# Patient Record
Sex: Female | Born: 1945 | Race: White | Hispanic: No | Marital: Married | Smoking: Never smoker
Health system: Southern US, Community
[De-identification: ages and names within clinical notes are randomized; demographics above are authoritative.]

## PROBLEM LIST (undated history)

## (undated) DIAGNOSIS — Z8619 Personal history of other infectious and parasitic diseases: Secondary | ICD-10-CM

## (undated) DIAGNOSIS — A809 Acute poliomyelitis, unspecified: Secondary | ICD-10-CM

## (undated) DIAGNOSIS — M2550 Pain in unspecified joint: Secondary | ICD-10-CM

## (undated) DIAGNOSIS — E785 Hyperlipidemia, unspecified: Secondary | ICD-10-CM

## (undated) DIAGNOSIS — H269 Unspecified cataract: Secondary | ICD-10-CM

## (undated) DIAGNOSIS — I10 Essential (primary) hypertension: Secondary | ICD-10-CM

## (undated) DIAGNOSIS — Z9289 Personal history of other medical treatment: Secondary | ICD-10-CM

## (undated) DIAGNOSIS — E039 Hypothyroidism, unspecified: Secondary | ICD-10-CM

## (undated) DIAGNOSIS — R3915 Urgency of urination: Secondary | ICD-10-CM

## (undated) DIAGNOSIS — R51 Headache: Secondary | ICD-10-CM

## (undated) DIAGNOSIS — M549 Dorsalgia, unspecified: Secondary | ICD-10-CM

## (undated) DIAGNOSIS — M2540 Effusion, unspecified joint: Secondary | ICD-10-CM

## (undated) DIAGNOSIS — K5790 Diverticulosis of intestine, part unspecified, without perforation or abscess without bleeding: Secondary | ICD-10-CM

## (undated) DIAGNOSIS — M1990 Unspecified osteoarthritis, unspecified site: Secondary | ICD-10-CM

## (undated) DIAGNOSIS — Z8709 Personal history of other diseases of the respiratory system: Secondary | ICD-10-CM

## (undated) DIAGNOSIS — R609 Edema, unspecified: Secondary | ICD-10-CM

## (undated) DIAGNOSIS — R350 Frequency of micturition: Secondary | ICD-10-CM

## (undated) DIAGNOSIS — M810 Age-related osteoporosis without current pathological fracture: Secondary | ICD-10-CM

## (undated) DIAGNOSIS — R519 Headache, unspecified: Secondary | ICD-10-CM

## (undated) DIAGNOSIS — J302 Other seasonal allergic rhinitis: Secondary | ICD-10-CM

## (undated) HISTORY — PX: COLONOSCOPY: SHX174

## (undated) HISTORY — PX: DILATION AND CURETTAGE OF UTERUS: SHX78

---
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
## 1976-01-29 HISTORY — PX: OTHER SURGICAL HISTORY: SHX169

## 1980-01-29 HISTORY — PX: ABDOMINAL HYSTERECTOMY: SHX81

## 2000-01-29 HISTORY — PX: CHOLECYSTECTOMY: SHX55

## 2013-01-28 HISTORY — PX: OTHER SURGICAL HISTORY: SHX169

## 2014-12-02 ENCOUNTER — Other Ambulatory Visit: Payer: Self-pay | Admitting: Orthopedic Surgery

## 2014-12-06 ENCOUNTER — Ambulatory Visit (HOSPITAL_COMMUNITY)
Admission: RE | Admit: 2014-12-06 | Discharge: 2014-12-06 | Disposition: A | Discharge status: Home / Self Care | Payer: Medicare Other | Source: Ambulatory Visit | Attending: Orthopedic Surgery | Admitting: Orthopedic Surgery

## 2014-12-06 ENCOUNTER — Encounter (HOSPITAL_COMMUNITY)
Admission: RE | Admit: 2014-12-06 | Discharge: 2014-12-06 | Disposition: A | Discharge status: Home / Self Care | Payer: Medicare Other | Source: Ambulatory Visit | Attending: Orthopedic Surgery | Admitting: Orthopedic Surgery

## 2014-12-06 ENCOUNTER — Encounter (HOSPITAL_COMMUNITY): Payer: Self-pay

## 2014-12-06 DIAGNOSIS — R001 Bradycardia, unspecified: Secondary | ICD-10-CM | POA: Diagnosis not present

## 2014-12-06 DIAGNOSIS — Z01818 Encounter for other preprocedural examination: Secondary | ICD-10-CM | POA: Insufficient documentation

## 2014-12-06 DIAGNOSIS — R9431 Abnormal electrocardiogram [ECG] [EKG]: Secondary | ICD-10-CM | POA: Diagnosis not present

## 2014-12-06 DIAGNOSIS — Z01810 Encounter for preprocedural cardiovascular examination: Secondary | ICD-10-CM | POA: Diagnosis not present

## 2014-12-06 DIAGNOSIS — I10 Essential (primary) hypertension: Secondary | ICD-10-CM | POA: Insufficient documentation

## 2014-12-06 DIAGNOSIS — I4510 Unspecified right bundle-branch block: Secondary | ICD-10-CM | POA: Insufficient documentation

## 2014-12-06 DIAGNOSIS — M1990 Unspecified osteoarthritis, unspecified site: Secondary | ICD-10-CM | POA: Diagnosis not present

## 2014-12-06 DIAGNOSIS — E785 Hyperlipidemia, unspecified: Secondary | ICD-10-CM | POA: Insufficient documentation

## 2014-12-06 DIAGNOSIS — E039 Hypothyroidism, unspecified: Secondary | ICD-10-CM | POA: Insufficient documentation

## 2014-12-06 DIAGNOSIS — Z79899 Other long term (current) drug therapy: Secondary | ICD-10-CM | POA: Insufficient documentation

## 2014-12-06 DIAGNOSIS — M1712 Unilateral primary osteoarthritis, left knee: Secondary | ICD-10-CM | POA: Diagnosis not present

## 2014-12-06 DIAGNOSIS — A809 Acute poliomyelitis, unspecified: Secondary | ICD-10-CM | POA: Insufficient documentation

## 2014-12-06 DIAGNOSIS — Z01812 Encounter for preprocedural laboratory examination: Secondary | ICD-10-CM | POA: Insufficient documentation

## 2014-12-06 DIAGNOSIS — N390 Urinary tract infection, site not specified: Secondary | ICD-10-CM | POA: Diagnosis not present

## 2014-12-06 HISTORY — DX: Headache, unspecified: R51.9

## 2014-12-06 HISTORY — DX: Unspecified cataract: H26.9

## 2014-12-06 HISTORY — DX: Hypothyroidism, unspecified: E03.9

## 2014-12-06 HISTORY — DX: Diverticulosis of intestine, part unspecified, without perforation or abscess without bleeding: K57.90

## 2014-12-06 HISTORY — DX: Personal history of other medical treatment: Z92.89

## 2014-12-06 HISTORY — DX: Headache: R51

## 2014-12-06 HISTORY — DX: Pain in unspecified joint: M25.50

## 2014-12-06 HISTORY — DX: Age-related osteoporosis without current pathological fracture: M81.0

## 2014-12-06 HISTORY — DX: Personal history of other infectious and parasitic diseases: Z86.19

## 2014-12-06 HISTORY — DX: Hyperlipidemia, unspecified: E78.5

## 2014-12-06 HISTORY — DX: Essential (primary) hypertension: I10

## 2014-12-06 HISTORY — DX: Edema, unspecified: R60.9

## 2014-12-06 HISTORY — DX: Urgency of urination: R39.15

## 2014-12-06 HISTORY — DX: Frequency of micturition: R35.0

## 2014-12-06 HISTORY — DX: Dorsalgia, unspecified: M54.9

## 2014-12-06 HISTORY — DX: Effusion, unspecified joint: M25.40

## 2014-12-06 HISTORY — DX: Personal history of other diseases of the respiratory system: Z87.09

## 2014-12-06 HISTORY — DX: Unspecified osteoarthritis, unspecified site: M19.90

## 2014-12-06 HISTORY — DX: Acute poliomyelitis, unspecified: A80.9

## 2014-12-06 HISTORY — DX: Other seasonal allergic rhinitis: J30.2

## 2014-12-06 LAB — CBC WITH DIFFERENTIAL/PLATELET
BASOS PCT: 0 %
Basophils Absolute: 0 10*3/uL (ref 0.0–0.1)
EOS ABS: 0.4 10*3/uL (ref 0.0–0.7)
EOS PCT: 3 %
HEMATOCRIT: 39.3 % (ref 36.0–46.0)
Hemoglobin: 12.6 g/dL (ref 12.0–15.0)
Lymphocytes Relative: 22 %
Lymphs Abs: 3 10*3/uL (ref 0.7–4.0)
MCH: 28.8 pg (ref 26.0–34.0)
MCHC: 32.1 g/dL (ref 30.0–36.0)
MCV: 89.9 fL (ref 78.0–100.0)
MONO ABS: 0.8 10*3/uL (ref 0.1–1.0)
MONOS PCT: 6 %
Neutro Abs: 9.4 10*3/uL — ABNORMAL HIGH (ref 1.7–7.7)
Neutrophils Relative %: 69 %
PLATELETS: 293 10*3/uL (ref 150–400)
RBC: 4.37 MIL/uL (ref 3.87–5.11)
RDW: 13.7 % (ref 11.5–15.5)
WBC: 13.6 10*3/uL — ABNORMAL HIGH (ref 4.0–10.5)

## 2014-12-06 LAB — URINALYSIS, ROUTINE W REFLEX MICROSCOPIC
Bilirubin Urine: NEGATIVE
Glucose, UA: NEGATIVE mg/dL
HGB URINE DIPSTICK: NEGATIVE
Ketones, ur: NEGATIVE mg/dL
NITRITE: NEGATIVE
PROTEIN: NEGATIVE mg/dL
Specific Gravity, Urine: 1.006 (ref 1.005–1.030)
UROBILINOGEN UA: 0.2 mg/dL (ref 0.0–1.0)
pH: 6.5 (ref 5.0–8.0)

## 2014-12-06 LAB — URINE MICROSCOPIC-ADD ON

## 2014-12-06 LAB — COMPREHENSIVE METABOLIC PANEL
ALBUMIN: 3.8 g/dL (ref 3.5–5.0)
ALT: 23 U/L (ref 14–54)
ANION GAP: 10 (ref 5–15)
AST: 24 U/L (ref 15–41)
Alkaline Phosphatase: 100 U/L (ref 38–126)
BILIRUBIN TOTAL: 0.8 mg/dL (ref 0.3–1.2)
BUN: 15 mg/dL (ref 6–20)
CHLORIDE: 101 mmol/L (ref 101–111)
CO2: 27 mmol/L (ref 22–32)
Calcium: 9 mg/dL (ref 8.9–10.3)
Creatinine, Ser: 0.92 mg/dL (ref 0.44–1.00)
GFR calc Af Amer: >60 mL/min (ref >60)
GFR calc non Af Amer: >60 mL/min (ref >60)
GLUCOSE: 115 mg/dL — AB (ref 65–99)
POTASSIUM: 4 mmol/L (ref 3.5–5.1)
SODIUM: 138 mmol/L (ref 135–145)
TOTAL PROTEIN: 7.6 g/dL (ref 6.5–8.1)

## 2014-12-06 LAB — SURGICAL PCR SCREEN
MRSA, PCR: NEGATIVE
STAPHYLOCOCCUS AUREUS: NEGATIVE

## 2014-12-06 LAB — PROTIME-INR
INR: 1.07 (ref 0.00–1.49)
Prothrombin Time: 14.1 seconds (ref 11.6–15.2)

## 2014-12-06 LAB — APTT: APTT: 35 s (ref 24–37)

## 2014-12-06 MED ORDER — CHLORHEXIDINE GLUCONATE 4 % EX LIQD
60.0000 mL | Freq: Once | CUTANEOUS | Status: DC
Start: 2014-12-06 — End: 2014-12-07

## 2014-12-06 MED ORDER — CHLORHEXIDINE GLUCONATE 4 % EX LIQD: 60.0000 mL | Freq: Once | CUTANEOUS | Status: DC

## 2014-12-06 NOTE — Pre-Procedure Instructions (Signed)
Carrie Shade CellarSusan House  12/06/2014      [facility] [id number] - [place], [place] - [address] AT [facility] [address] [place] [id number] Phone: [phone] Fax: [phone]    Your procedure is scheduled on Mon, Nov 21 @ 11:15 AM  [facility] Admitting at 9:15 AM  Call this number if you have problems the morning of surgery:  [phone]   Remember:  Do not eat food or drink liquids after midnight.  Take these medicines the morning of surgery with A SIP OF WATER Flonase(Fluticasone) and Synthroid(Levothyroxine)               No Goody's,BC's,Aleve,Aspirin,Ibuprofen,Advil,Motrin,Fish Oil,or any Herbal Medications.   Do not wear jewelry, make-up or nail polish.  Do not wear lotions, powders, or perfumes.  You may wear deodorant.  Do not shave 48 hours prior to surgery.    Do not bring valuables to the hospital.  [facility] is not responsible for any belongings or valuables.  Contacts, dentures or bridgework may not be worn into surgery.  Leave your suitcase in the car.  After surgery it may be brought to your room.  For patients admitted to the hospital, discharge time will be determined by your treatment team.  Patients discharged the day of surgery will not be allowed to drive home.    Special instructions: [place] - Preparing for Surgery  Before surgery, you can play an important role.  Because skin is not sterile, your skin needs to be as free of germs as possible.  You can reduce the number of germs on you skin by washing with CHG (chlorahexidine gluconate) soap before surgery.  CHG is an antiseptic cleaner which kills germs and bonds with the skin to continue killing germs even after washing.  Please DO NOT use if you have an allergy to CHG or antibacterial soaps.  If your skin becomes reddened/irritated stop using the CHG and inform your nurse when you arrive at Short Stay.  Do not shave (including legs and  underarms) for at least 48 hours prior to the first CHG shower.  You may shave your face.  Please follow these instructions carefully:   1.  Shower with CHG Soap the night before surgery and the                                morning of Surgery.  2.  If you choose to wash your hair, wash your hair first as usual with your       normal shampoo.  3.  After you shampoo, rinse your hair and body thoroughly to remove the                      Shampoo.  4.  Use CHG as you would any other liquid soap.  You can apply chg directly       to the skin and wash gently with scrungie or a clean washcloth.  5.  Apply the CHG Soap to your body ONLY FROM THE NECK DOWN.        Do not use on open wounds or open sores.  Avoid contact with your eyes,       ears, mouth and genitals (private parts).  Wash genitals (private parts)       with your normal soap.  6.  Wash thoroughly, paying  special attention to the area where your surgery        will be performed.  7.  Thoroughly rinse your body with warm water from the neck down.  8.  DO NOT shower/wash with your normal soap after using and rinsing off       the CHG Soap.  9.  Pat yourself dry with a clean towel.            10.  Wear clean pajamas.            11.  Place clean sheets on your bed the night of your first shower and do not        sleep with pets.  Day of Surgery  Do not apply any lotions/deoderants the morning of surgery.  Please wear clean clothes to the hospital/surgery center.     Please read over the following fact sheets that you were given. Pain Booklet, Coughing and Deep Breathing, MRSA Information and Surgical Site Infection Prevention

## 2014-12-06 NOTE — Progress Notes (Addendum)
Medical Md is Dr.Paul Maryellen Pileason in [place]-[phone]  Cardiologist denies having one  Echo denies ever having one  Stress test done 15+ yrs ago(had this done d/t Fosamax causing chest pain)  Heart cath 15+ yrs ago states clean and was determined that Fosamax was the cause of chest pain  EKG denies in past yr  CXR denies having in past yr

## 2014-12-08 LAB — URINE CULTURE: Culture: >=100000

## 2014-12-08 NOTE — Progress Notes (Signed)
Anesthesia Chart Review:  Pt is 69 year old female scheduled for L total knee arthroplasty on 12/19/2014 with Dr. Sherlean FootLucey.   PMH includes: HTN, hyperlipidemia, hypothyroidism, polio. Never smoker. BMI 38.   Medications include: amlodipine, lasix, levothyroidism, losartan.   Preoperative labs reviewed.  Pt has UTI. Notified Dr. Tobin ChadLucey's office.   Chest x-ray 12/06/14 reviewed. No active cardiopulmonary disease.   EKG 12/06/14: Sinus bradycardia (59 bpm). RBBB.   If no changes, I anticipate pt can proceed with surgery as scheduled.   Rica Mastngela Chyrl Elwell, FNP-BC [facility]/Anesthesiology Phone: [phone] 12/08/2014 11:48 AM

## 2014-12-16 MED ORDER — BUPIVACAINE LIPOSOME 1.3 % IJ SUSP
20.0000 mL | Freq: Once | INTRAMUSCULAR | Status: AC
  Administered 2014-12-19: 20 mL
  Filled 2014-12-16: qty 20

## 2014-12-16 MED ORDER — SODIUM CHLORIDE 0.9 % IV SOLN: INTRAVENOUS | Status: DC

## 2014-12-16 MED ORDER — TRANEXAMIC ACID 1000 MG/10ML IV SOLN
1000.0000 mg | INTRAVENOUS | Status: AC
  Administered 2014-12-19: 1000 mg via INTRAVENOUS
  Filled 2014-12-16: qty 10

## 2014-12-19 ENCOUNTER — Inpatient Hospital Stay (HOSPITAL_COMMUNITY): Payer: Medicare Other | Admitting: Emergency Medicine

## 2014-12-19 ENCOUNTER — Inpatient Hospital Stay (HOSPITAL_COMMUNITY)
Admission: RE | Admit: 2014-12-19 | Discharge: 2014-12-20 | DRG: 470 | Disposition: A | Discharge status: Home Health | Payer: Medicare Other | Source: Ambulatory Visit | Attending: Orthopedic Surgery | Admitting: Orthopedic Surgery

## 2014-12-19 ENCOUNTER — Encounter (HOSPITAL_COMMUNITY)
Admission: RE | Disposition: A | Discharge status: Home Health | Payer: Self-pay | Source: Ambulatory Visit | Attending: Orthopedic Surgery

## 2014-12-19 ENCOUNTER — Inpatient Hospital Stay (HOSPITAL_COMMUNITY): Payer: Medicare Other | Admitting: Anesthesiology

## 2014-12-19 ENCOUNTER — Encounter (HOSPITAL_COMMUNITY): Payer: Self-pay | Admitting: Anesthesiology

## 2014-12-19 DIAGNOSIS — Z91048 Other nonmedicinal substance allergy status: Secondary | ICD-10-CM | POA: Diagnosis not present

## 2014-12-19 DIAGNOSIS — D62 Acute posthemorrhagic anemia: Secondary | ICD-10-CM | POA: Diagnosis not present

## 2014-12-19 DIAGNOSIS — R600 Localized edema: Secondary | ICD-10-CM | POA: Diagnosis present

## 2014-12-19 DIAGNOSIS — Z8619 Personal history of other infectious and parasitic diseases: Secondary | ICD-10-CM | POA: Diagnosis not present

## 2014-12-19 DIAGNOSIS — J302 Other seasonal allergic rhinitis: Secondary | ICD-10-CM | POA: Diagnosis present

## 2014-12-19 DIAGNOSIS — Z888 Allergy status to other drugs, medicaments and biological substances status: Secondary | ICD-10-CM | POA: Diagnosis not present

## 2014-12-19 DIAGNOSIS — M1712 Unilateral primary osteoarthritis, left knee: Secondary | ICD-10-CM | POA: Diagnosis present

## 2014-12-19 DIAGNOSIS — E039 Hypothyroidism, unspecified: Secondary | ICD-10-CM | POA: Diagnosis present

## 2014-12-19 DIAGNOSIS — I10 Essential (primary) hypertension: Secondary | ICD-10-CM | POA: Diagnosis present

## 2014-12-19 DIAGNOSIS — Z881 Allergy status to other antibiotic agents status: Secondary | ICD-10-CM | POA: Diagnosis not present

## 2014-12-19 DIAGNOSIS — M810 Age-related osteoporosis without current pathological fracture: Secondary | ICD-10-CM | POA: Diagnosis present

## 2014-12-19 DIAGNOSIS — Z96659 Presence of unspecified artificial knee joint: Secondary | ICD-10-CM

## 2014-12-19 DIAGNOSIS — M25562 Pain in left knee: Secondary | ICD-10-CM | POA: Diagnosis present

## 2014-12-19 HISTORY — PX: TOTAL KNEE ARTHROPLASTY: SHX125

## 2014-12-19 LAB — CBC
HCT: 35.8 % — ABNORMAL LOW (ref 36.0–46.0)
HEMOGLOBIN: 11.3 g/dL — AB (ref 12.0–15.0)
MCH: 28.8 pg (ref 26.0–34.0)
MCHC: 31.6 g/dL (ref 30.0–36.0)
MCV: 91.3 fL (ref 78.0–100.0)
PLATELETS: 267 10*3/uL (ref 150–400)
RBC: 3.92 MIL/uL (ref 3.87–5.11)
RDW: 13.9 % (ref 11.5–15.5)
WBC: 21.5 10*3/uL — AB (ref 4.0–10.5)

## 2014-12-19 LAB — CREATININE, SERUM: CREATININE: 0.82 mg/dL (ref 0.44–1.00)

## 2014-12-19 SURGERY — ARTHROPLASTY, KNEE, TOTAL
Anesthesia: Monitor Anesthesia Care | Site: Knee | Laterality: Left

## 2014-12-19 MED ORDER — SENNA 8.6 MG PO TABS
1.0000 | ORAL_TABLET | Freq: Two times a day (BID) | ORAL | Status: DC
  Administered 2014-12-19 – 2014-12-20 (×2): 8.6 mg via ORAL
  Filled 2014-12-19 (×2): qty 1

## 2014-12-19 MED ORDER — MENTHOL 3 MG MT LOZG: 1.0000 | LOZENGE | OROMUCOSAL | Status: DC | PRN

## 2014-12-19 MED ORDER — FUROSEMIDE 20 MG PO TABS
20.0000 mg | ORAL_TABLET | Freq: Every day | ORAL | Status: DC
  Filled 2014-12-19: qty 1

## 2014-12-19 MED ORDER — BUPIVACAINE IN DEXTROSE 0.75-8.25 % IT SOLN
INTRATHECAL | Status: DC | PRN
  Administered 2014-12-19: 1.8 mL via INTRATHECAL

## 2014-12-19 MED ORDER — TIZANIDINE HCL 4 MG PO TABS
2.0000 mg | ORAL_TABLET | Freq: Four times a day (QID) | ORAL | Status: DC | PRN
Start: 2014-12-19 — End: 2014-12-20
  Administered 2014-12-20: 2 mg via ORAL
  Filled 2014-12-19: qty 1

## 2014-12-19 MED ORDER — LIDOCAINE HCL (CARDIAC) 20 MG/ML IV SOLN
INTRAVENOUS | Status: DC | PRN
  Administered 2014-12-19: 100 mg via INTRAVENOUS

## 2014-12-19 MED ORDER — LACTATED RINGERS IV SOLN
INTRAVENOUS | Status: DC | PRN
  Administered 2014-12-19 (×2): via INTRAVENOUS

## 2014-12-19 MED ORDER — LOSARTAN POTASSIUM 50 MG PO TABS
100.0000 mg | ORAL_TABLET | Freq: Every day | ORAL | Status: DC
  Filled 2014-12-19: qty 2

## 2014-12-19 MED ORDER — DIPHENHYDRAMINE HCL 12.5 MG/5ML PO ELIX: 12.5000 mg | ORAL_SOLUTION | ORAL | Status: DC | PRN

## 2014-12-19 MED ORDER — LEVOTHYROXINE SODIUM 100 MCG PO TABS
100.0000 ug | ORAL_TABLET | Freq: Every day | ORAL | Status: DC
  Administered 2014-12-20: 100 ug via ORAL
  Filled 2014-12-19: qty 1

## 2014-12-19 MED ORDER — PROPOFOL 500 MG/50ML IV EMUL
INTRAVENOUS | Status: DC | PRN
  Administered 2014-12-19: 35 ug/kg/min via INTRAVENOUS

## 2014-12-19 MED ORDER — BUPIVACAINE-EPINEPHRINE (PF) 0.5% -1:200000 IJ SOLN
INTRAMUSCULAR | Status: AC
  Filled 2014-12-19: qty 30

## 2014-12-19 MED ORDER — PHENOL 1.4 % MT LIQD: 1.0000 | OROMUCOSAL | Status: DC | PRN

## 2014-12-19 MED ORDER — FENTANYL CITRATE (PF) 250 MCG/5ML IJ SOLN
INTRAMUSCULAR | Status: AC
  Filled 2014-12-19: qty 5

## 2014-12-19 MED ORDER — MIDAZOLAM HCL 2 MG/2ML IJ SOLN
INTRAMUSCULAR | Status: AC
  Filled 2014-12-19: qty 2

## 2014-12-19 MED ORDER — ACETAMINOPHEN 650 MG RE SUPP
650.0000 mg | Freq: Four times a day (QID) | RECTAL | Status: DC | PRN
Start: 2014-12-19 — End: 2014-12-20

## 2014-12-19 MED ORDER — SODIUM CHLORIDE 0.9 % IR SOLN
Status: DC | PRN
  Administered 2014-12-19: 1000 mL

## 2014-12-19 MED ORDER — ACETAMINOPHEN 325 MG PO TABS: 650.0000 mg | ORAL_TABLET | Freq: Four times a day (QID) | ORAL | Status: DC | PRN

## 2014-12-19 MED ORDER — ACETAMINOPHEN 160 MG/5ML PO SOLN
325.0000 mg | ORAL | Status: DC | PRN
  Filled 2014-12-19: qty 20.3

## 2014-12-19 MED ORDER — TRANEXAMIC ACID 1000 MG/10ML IV SOLN: 1000.0000 mg | Freq: Once | INTRAVENOUS | Status: DC

## 2014-12-19 MED ORDER — FLEET ENEMA 7-19 GM/118ML RE ENEM: 1.0000 | ENEMA | Freq: Once | RECTAL | Status: DC | PRN

## 2014-12-19 MED ORDER — PROPOFOL 10 MG/ML IV BOLUS
INTRAVENOUS | Status: DC | PRN
  Administered 2014-12-19: 10 mg via INTRAVENOUS
  Administered 2014-12-19: 20 mg via INTRAVENOUS
  Administered 2014-12-19: 30 mg via INTRAVENOUS
  Administered 2014-12-19 (×2): 20 mg via INTRAVENOUS

## 2014-12-19 MED ORDER — PROPOFOL 10 MG/ML IV BOLUS
INTRAVENOUS | Status: AC
  Filled 2014-12-19: qty 20

## 2014-12-19 MED ORDER — ZOLPIDEM TARTRATE 5 MG PO TABS: 5.0000 mg | ORAL_TABLET | Freq: Every evening | ORAL | Status: DC | PRN

## 2014-12-19 MED ORDER — METOCLOPRAMIDE HCL 5 MG/ML IJ SOLN: 5.0000 mg | Freq: Three times a day (TID) | INTRAMUSCULAR | Status: DC | PRN

## 2014-12-19 MED ORDER — OXYCODONE HCL ER 10 MG PO T12A
10.0000 mg | EXTENDED_RELEASE_TABLET | Freq: Two times a day (BID) | ORAL | Status: DC
  Administered 2014-12-19 – 2014-12-20 (×2): 10 mg via ORAL
  Filled 2014-12-19 (×2): qty 1

## 2014-12-19 MED ORDER — SODIUM CHLORIDE 0.9 % IV SOLN
INTRAVENOUS | Status: DC
  Administered 2014-12-19: 21:00:00 via INTRAVENOUS

## 2014-12-19 MED ORDER — ENOXAPARIN SODIUM 30 MG/0.3ML ~~LOC~~ SOLN
30.0000 mg | Freq: Two times a day (BID) | SUBCUTANEOUS | Status: DC
  Administered 2014-12-20: 30 mg via SUBCUTANEOUS
  Filled 2014-12-19: qty 0.3

## 2014-12-19 MED ORDER — SODIUM CHLORIDE 0.9 % IJ SOLN
INTRAMUSCULAR | Status: DC | PRN
  Administered 2014-12-19: 20 mL

## 2014-12-19 MED ORDER — ONDANSETRON HCL 4 MG PO TABS: 4.0000 mg | ORAL_TABLET | Freq: Four times a day (QID) | ORAL | Status: DC | PRN

## 2014-12-19 MED ORDER — MIDAZOLAM HCL 5 MG/5ML IJ SOLN
INTRAMUSCULAR | Status: DC | PRN
  Administered 2014-12-19: 2 mg via INTRAVENOUS

## 2014-12-19 MED ORDER — FLUTICASONE PROPIONATE 50 MCG/ACT NA SUSP
2.0000 | Freq: Every day | NASAL | Status: DC | PRN
  Filled 2014-12-19: qty 16

## 2014-12-19 MED ORDER — HYDROMORPHONE HCL 1 MG/ML IJ SOLN
1.0000 mg | INTRAMUSCULAR | Status: DC | PRN
  Administered 2014-12-19 – 2014-12-20 (×2): 1 mg via INTRAVENOUS
  Filled 2014-12-19 (×2): qty 1

## 2014-12-19 MED ORDER — METOCLOPRAMIDE HCL 5 MG PO TABS: 5.0000 mg | ORAL_TABLET | Freq: Three times a day (TID) | ORAL | Status: DC | PRN

## 2014-12-19 MED ORDER — CELECOXIB 200 MG PO CAPS
200.0000 mg | ORAL_CAPSULE | Freq: Two times a day (BID) | ORAL | Status: DC
  Administered 2014-12-19 – 2014-12-20 (×2): 200 mg via ORAL
  Filled 2014-12-19 (×2): qty 1

## 2014-12-19 MED ORDER — OXYCODONE HCL 5 MG/5ML PO SOLN: 5.0000 mg | Freq: Once | ORAL | Status: DC | PRN

## 2014-12-19 MED ORDER — SORBITOL 70 % SOLN: 30.0000 mL | Freq: Every day | Status: DC | PRN

## 2014-12-19 MED ORDER — HYDROMORPHONE HCL 1 MG/ML IJ SOLN
0.2500 mg | INTRAMUSCULAR | Status: DC | PRN
  Administered 2014-12-19 (×4): 0.5 mg via INTRAVENOUS

## 2014-12-19 MED ORDER — ONDANSETRON HCL 4 MG/2ML IJ SOLN
4.0000 mg | Freq: Four times a day (QID) | INTRAMUSCULAR | Status: DC | PRN
  Administered 2014-12-19: 4 mg via INTRAVENOUS
  Filled 2014-12-19: qty 2

## 2014-12-19 MED ORDER — CEFAZOLIN SODIUM-DEXTROSE 2-3 GM-% IV SOLR
INTRAVENOUS | Status: DC | PRN
  Administered 2014-12-19: 2 g via INTRAVENOUS

## 2014-12-19 MED ORDER — HYDROMORPHONE HCL 1 MG/ML IJ SOLN
INTRAMUSCULAR | Status: AC
  Filled 2014-12-19: qty 1

## 2014-12-19 MED ORDER — SENNOSIDES-DOCUSATE SODIUM 8.6-50 MG PO TABS: 1.0000 | ORAL_TABLET | Freq: Every evening | ORAL | Status: DC | PRN

## 2014-12-19 MED ORDER — OXYCODONE HCL 5 MG PO TABS: 5.0000 mg | ORAL_TABLET | Freq: Once | ORAL | Status: DC | PRN

## 2014-12-19 MED ORDER — BUPIVACAINE-EPINEPHRINE 0.5% -1:200000 IJ SOLN
INTRAMUSCULAR | Status: DC | PRN
  Administered 2014-12-19: 30 mL

## 2014-12-19 MED ORDER — 0.9 % SODIUM CHLORIDE (POUR BTL) OPTIME
TOPICAL | Status: DC | PRN
  Administered 2014-12-19: 1000 mL

## 2014-12-19 MED ORDER — ONDANSETRON HCL 4 MG/2ML IJ SOLN
INTRAMUSCULAR | Status: DC | PRN
  Administered 2014-12-19: 4 mg via INTRAVENOUS

## 2014-12-19 MED ORDER — FENTANYL CITRATE (PF) 100 MCG/2ML IJ SOLN
INTRAMUSCULAR | Status: DC | PRN
  Administered 2014-12-19 (×5): 50 ug via INTRAVENOUS

## 2014-12-19 MED ORDER — OXYCODONE HCL 5 MG PO TABS
5.0000 mg | ORAL_TABLET | ORAL | Status: DC | PRN
  Administered 2014-12-19 (×2): 5 mg via ORAL
  Administered 2014-12-20 (×3): 10 mg via ORAL
  Filled 2014-12-19: qty 1
  Filled 2014-12-19 (×2): qty 2
  Filled 2014-12-19: qty 1
  Filled 2014-12-19: qty 2

## 2014-12-19 MED ORDER — ACETAMINOPHEN 325 MG PO TABS: 325.0000 mg | ORAL_TABLET | ORAL | Status: DC | PRN

## 2014-12-19 MED ORDER — AMLODIPINE BESYLATE 5 MG PO TABS
5.0000 mg | ORAL_TABLET | Freq: Every day | ORAL | Status: DC
  Administered 2014-12-19: 5 mg via ORAL
  Filled 2014-12-19: qty 1

## 2014-12-19 MED ORDER — VANCOMYCIN HCL IN DEXTROSE 1-5 GM/200ML-% IV SOLN
1000.0000 mg | Freq: Two times a day (BID) | INTRAVENOUS | Status: AC
  Administered 2014-12-19: 1000 mg via INTRAVENOUS
  Filled 2014-12-19: qty 200

## 2014-12-19 SURGICAL SUPPLY — 58 items
BANDAGE ESMARK 6X9 LF (GAUZE/BANDAGES/DRESSINGS) ×1 IMPLANT
BLADE SAGITTAL 13X1.27X60 (BLADE) ×2 IMPLANT
BLADE SAGITTAL 13X1.27X60MM (BLADE) ×1
BLADE SAW SGTL 83.5X18.5 (BLADE) ×3 IMPLANT
BLADE SURG 10 STRL SS (BLADE) ×3 IMPLANT
BNDG ESMARK 6X9 LF (GAUZE/BANDAGES/DRESSINGS) ×3
BOWL SMART MIX CTS (DISPOSABLE) ×3 IMPLANT
CAPT KNEE TOTAL 3 ×3 IMPLANT
CEMENT BONE SIMPLEX SPEEDSET (Cement) ×6 IMPLANT
COVER SURGICAL LIGHT HANDLE (MISCELLANEOUS) ×3 IMPLANT
CUFF TOURNIQUET SINGLE 34IN LL (TOURNIQUET CUFF) ×3 IMPLANT
DRAPE EXTREMITY T 121X128X90 (DRAPE) ×3 IMPLANT
DRAPE INCISE IOBAN 66X45 STRL (DRAPES) ×6 IMPLANT
DRAPE PROXIMA HALF (DRAPES) IMPLANT
DRAPE U-SHAPE 47X51 STRL (DRAPES) ×3 IMPLANT
DRSG ADAPTIC 3X8 NADH LF (GAUZE/BANDAGES/DRESSINGS) ×3 IMPLANT
DRSG PAD ABDOMINAL 8X10 ST (GAUZE/BANDAGES/DRESSINGS) ×3 IMPLANT
DURAPREP 26ML APPLICATOR (WOUND CARE) ×6 IMPLANT
ELECT REM PT RETURN 9FT ADLT (ELECTROSURGICAL) ×3
ELECTRODE REM PT RTRN 9FT ADLT (ELECTROSURGICAL) ×1 IMPLANT
GAUZE SPONGE 4X4 12PLY STRL (GAUZE/BANDAGES/DRESSINGS) ×3 IMPLANT
GLOVE BIOGEL M 7.0 STRL (GLOVE) IMPLANT
GLOVE BIOGEL PI IND STRL 7.5 (GLOVE) IMPLANT
GLOVE BIOGEL PI IND STRL 8.5 (GLOVE) ×2 IMPLANT
GLOVE BIOGEL PI INDICATOR 7.5 (GLOVE)
GLOVE BIOGEL PI INDICATOR 8.5 (GLOVE) ×4
GLOVE SURG ORTHO 8.0 STRL STRW (GLOVE) ×6 IMPLANT
GOWN STRL REUS W/ TWL LRG LVL3 (GOWN DISPOSABLE) ×1 IMPLANT
GOWN STRL REUS W/ TWL XL LVL3 (GOWN DISPOSABLE) ×2 IMPLANT
GOWN STRL REUS W/TWL LRG LVL3 (GOWN DISPOSABLE) ×2
GOWN STRL REUS W/TWL XL LVL3 (GOWN DISPOSABLE) ×4
HANDPIECE INTERPULSE COAX TIP (DISPOSABLE) ×2
HOOD PEEL AWAY FACE SHEILD DIS (HOOD) ×9 IMPLANT
KIT BASIN OR (CUSTOM PROCEDURE TRAY) ×3 IMPLANT
KIT ROOM TURNOVER OR (KITS) ×3 IMPLANT
KNEE CAPITATED TOTAL 3 ×1 IMPLANT
MANIFOLD NEPTUNE II (INSTRUMENTS) ×3 IMPLANT
NEEDLE 22X1 1/2 (OR ONLY) (NEEDLE) ×6 IMPLANT
NS IRRIG 1000ML POUR BTL (IV SOLUTION) ×3 IMPLANT
PACK TOTAL JOINT (CUSTOM PROCEDURE TRAY) ×3 IMPLANT
PACK UNIVERSAL I (CUSTOM PROCEDURE TRAY) ×3 IMPLANT
PAD ARMBOARD 7.5X6 YLW CONV (MISCELLANEOUS) ×6 IMPLANT
PADDING CAST ABS 6INX4YD NS (CAST SUPPLIES) ×2
PADDING CAST ABS COTTON 6X4 NS (CAST SUPPLIES) ×1 IMPLANT
PADDING CAST COTTON 6X4 STRL (CAST SUPPLIES) ×3 IMPLANT
SET HNDPC FAN SPRY TIP SCT (DISPOSABLE) ×1 IMPLANT
STAPLER VISISTAT 35W (STAPLE) ×3 IMPLANT
SUCTION FRAZIER TIP 10 FR DISP (SUCTIONS) ×3 IMPLANT
SUT BONE WAX W31G (SUTURE) ×3 IMPLANT
SUT VIC AB 0 CTB1 27 (SUTURE) ×6 IMPLANT
SUT VIC AB 1 CT1 27 (SUTURE) ×4
SUT VIC AB 1 CT1 27XBRD ANBCTR (SUTURE) ×2 IMPLANT
SUT VIC AB 2-0 CT1 27 (SUTURE) ×4
SUT VIC AB 2-0 CT1 TAPERPNT 27 (SUTURE) ×2 IMPLANT
SYR 20CC LL (SYRINGE) ×6 IMPLANT
TOWEL OR 17X24 6PK STRL BLUE (TOWEL DISPOSABLE) ×3 IMPLANT
TOWEL OR 17X26 10 PK STRL BLUE (TOWEL DISPOSABLE) ×3 IMPLANT
WATER STERILE IRR 1000ML POUR (IV SOLUTION) ×6 IMPLANT

## 2014-12-19 NOTE — Transfer of Care (Signed)
Immediate Anesthesia Transfer of Care Note  Patient: Carrie CellarSusan House  Procedure(s) Performed: Procedure(s): LEFT TOTAL KNEE ARTHROPLASTY (Left)  Patient Location: PACU  Anesthesia Type:MAC and Spinal  Level of Consciousness: awake, alert , oriented and sedated  Airway & Oxygen Therapy: Patient Spontanous Breathing and Patient connected to nasal cannula oxygen  Post-op Assessment: Report given to RN, Post -op Vital signs reviewed and stable and Patient moving all extremities  Post vital signs: Reviewed and stable  Last Vitals:  Filed Vitals:   12/19/14 0840  BP: 149/59  Pulse: 55  Temp: 36.1 C  Resp: 20    Complications: No apparent anesthesia complications

## 2014-12-19 NOTE — Anesthesia Procedure Notes (Signed)
Spinal Patient location during procedure: OR Staffing Anesthesiologist: Gesselle Fitzsimons Preanesthetic Checklist Completed: patient identified, surgical consent, pre-op evaluation, timeout performed, IV checked, risks and benefits discussed and monitors and equipment checked Spinal Block Patient position: sitting Prep: site prepped and draped and DuraPrep Patient monitoring: heart rate, cardiac monitor, continuous pulse ox and blood pressure Approach: midline Location: L3-4 Injection technique: single-shot Needle Needle type: Pencan  Needle gauge: 24 G Needle length: 10 cm Assessment Sensory level: T6   

## 2014-12-19 NOTE — Progress Notes (Signed)
RT set up CPAP for patient with home mask and oxygen bled in. Patient tolerating well at this time.

## 2014-12-19 NOTE — Anesthesia Preprocedure Evaluation (Signed)
Anesthesia Evaluation  Patient identified by MRN, date of birth, ID band Patient awake    Reviewed: Allergy & Precautions, NPO status , Patient's Chart, lab work & pertinent test results  History of Anesthesia Complications Negative for: history of anesthetic complications  Airway Mallampati: II  TM Distance: >3 FB Neck ROM: Full    Dental  (+) Teeth Intact   Pulmonary neg pulmonary ROS,    breath sounds clear to auscultation       Cardiovascular hypertension, Pt. on medications (-) angina(-) Past MI and (-) CHF (-) dysrhythmias  Rhythm:Regular     Neuro/Psych  Headaches, Post polio with right leg atrophy negative psych ROS   GI/Hepatic   Endo/Other  Hypothyroidism Morbid obesity  Renal/GU negative Renal ROS     Musculoskeletal  (+) Arthritis ,   Abdominal   Peds  Hematology   Anesthesia Other Findings   Reproductive/Obstetrics                             Anesthesia Physical Anesthesia Plan  ASA: III  Anesthesia Plan: Spinal   Post-op Pain Management:    Induction:   Airway Management Planned: Natural Airway, Nasal Cannula and Simple Face Mask  Additional Equipment: None  Intra-op Plan:   Post-operative Plan:   Informed Consent: I have reviewed the patients History and Physical, chart, labs and discussed the procedure including the risks, benefits and alternatives for the proposed anesthesia with the patient or authorized representative who has indicated his/her understanding and acceptance.   Dental advisory given  Plan Discussed with: CRNA and Surgeon  Anesthesia Plan Comments:         Anesthesia Quick Evaluation

## 2014-12-19 NOTE — H&P (Signed)
[place] CellarSusan House MRN:  [id number] DOB/SEX:  11-20-1945/female  CHIEF COMPLAINT:  Painful left Knee  HISTORY: Patient is a 69 y.o. female presented with a history of pain in the left knee. Onset of symptoms was gradual starting several years ago with gradually worsening course since that time. Prior procedures on the knee include none. Patient has been treated conservatively with over-the-counter NSAIDs and activity modification. Patient currently rates pain in the knee at 10 out of 10 with activity. There is pain at night.  PAST MEDICAL HISTORY: There are no active problems to display for this patient.  Past Medical History  Diagnosis Date  . Hypertension     takes Amlodipine and Losartan daily  . Hypothyroidism     takes Synthroid daily  . Polio     at age 35  . Diverticulosis   . Hyperlipidemia     has never needed to be on meds per pt  . Peripheral edema     takes Lasix daily  . History of bronchitis several yrs ago  . Seasonal allergies     uses Flonase as needed  . Headache     occasionally  . Arthritis   . Joint pain   . Joint swelling   . Back pain     DDD and scoliosis  . Osteoporosis   . Urinary frequency   . Urinary urgency   . History of blood transfusion     no abnormal reaction noted  . Cataracts, bilateral     immature  . History of shingles    Past Surgical History  Procedure Laterality Date  . Right foot surgery  1956  . Left knee surgery    1958    stapling of the distal femoral  . Tip of toe removed from small toe on right foot  1978  . Abdominal hysterectomy  1982  . Cholecystectomy  2002  . Spark sling  2015  . Colonoscopy    . Dilation and curettage of uterus       MEDICATIONS:   No prescriptions prior to admission    ALLERGIES:   Allergies  Allergen Reactions  . Ceftin [Cefuroxime Axetil] Shortness Of Breath  . Actifed Cold-Allergy [Chlorpheniramine-Phenylephrine] Other (See Comments)    Heart races  . Adhesive [Tape]     Irritation  to skin if left on too long  . Fosamax [Alendronate Sodium] Other (See Comments)    Chest pains - heart attack feeling  . Lisinopril Cough  . Miacalcin [Calcitonin ([place])] Other (See Comments)    Leg pain  . Robaxin [Methocarbamol] Other (See Comments)    Leg pain  . Triprolidine-Pseudoephedrine     REVIEW OF SYSTEMS:  Pertinent items noted in HPI and remainder of comprehensive ROS otherwise negative.   FAMILY HISTORY:  No family history on file.  SOCIAL HISTORY:   Social History  Substance Use Topics  . Smoking status: Never Smoker   . Smokeless tobacco: Not on file  . Alcohol Use: No     EXAMINATION:  Vital signs in last 24 hours:    General appearance: alert, cooperative and no distress Lungs: clear to auscultation bilaterally Heart: regular rate and rhythm, S1, S2 normal, no murmur, click, rub or gallop Abdomen: soft, non-tender; bowel sounds normal; no masses,  no organomegaly Extremities: extremities normal, atraumatic, no cyanosis or edema and Homans sign is negative, no sign of DVT Pulses: 2+ and symmetric Skin: Skin color, texture, turgor normal. No rashes or lesions Neurologic: Alert and oriented  X 3, normal strength and tone. Normal symmetric reflexes. Normal coordination and gait  Musculoskeletal:  ROM 0-100, Ligaments intact,  Imaging Review Plain radiographs demonstrate severe degenerative joint disease of the left knee. The overall alignment is significant varus. The bone quality appears to be good for age and reported activity level.  Assessment/Plan: Primary osteoarthritis, left knee   The patient history, physical examination and imaging studies are consistent with advanced degenerative joint disease of the left knee. The patient has failed conservative treatment.  The clearance notes were reviewed.  After discussion with the patient it was felt that Total Knee Replacement was indicated. The procedure,  risks, and benefits of total knee arthroplasty  were presented and reviewed. The risks including but not limited to aseptic loosening, infection, blood clots, vascular injury, stiffness, patella tracking problems complications among others were discussed. The patient acknowledged the explanation, agreed to proceed with the plan.  Carrie House 12/19/2014, 6:44 AM

## 2014-12-19 NOTE — Progress Notes (Signed)
Orthopedic Tech Progress Note Patient Details:  [name] 1945-06-15 [id number]  CPM Left Knee CPM Left Knee: On Left Knee Flexion (Degrees): 90 Left Knee Extension (Degrees): 0 Additional Comments: trapeze bar patient helper Viewed order from doctor's order list  Nikki DomCrawford, Ryland Smoots 12/19/2014, 2:48 PM

## 2014-12-20 ENCOUNTER — Encounter (HOSPITAL_COMMUNITY): Payer: Self-pay | Admitting: Orthopedic Surgery

## 2014-12-20 LAB — CBC
HCT: 31.9 % — ABNORMAL LOW (ref 36.0–46.0)
Hemoglobin: 10.3 g/dL — ABNORMAL LOW (ref 12.0–15.0)
MCH: 29.7 pg (ref 26.0–34.0)
MCHC: 32.3 g/dL (ref 30.0–36.0)
MCV: 91.9 fL (ref 78.0–100.0)
PLATELETS: 240 10*3/uL (ref 150–400)
RBC: 3.47 MIL/uL — AB (ref 3.87–5.11)
RDW: 14 % (ref 11.5–15.5)
WBC: 16.1 10*3/uL — AB (ref 4.0–10.5)

## 2014-12-20 LAB — BASIC METABOLIC PANEL
ANION GAP: 7 (ref 5–15)
BUN: 9 mg/dL (ref 6–20)
CALCIUM: 8.2 mg/dL — AB (ref 8.9–10.3)
CO2: 28 mmol/L (ref 22–32)
Chloride: 105 mmol/L (ref 101–111)
Creatinine, Ser: 0.9 mg/dL (ref 0.44–1.00)
Glucose, Bld: 122 mg/dL — ABNORMAL HIGH (ref 65–99)
Potassium: 3.8 mmol/L (ref 3.5–5.1)
SODIUM: 140 mmol/L (ref 135–145)

## 2014-12-20 MED ORDER — ENOXAPARIN SODIUM 40 MG/0.4ML ~~LOC~~ SOLN: 40.0000 mg | SUBCUTANEOUS | Status: AC

## 2014-12-20 MED ORDER — TIZANIDINE HCL 2 MG PO TABS: 2.0000 mg | ORAL_TABLET | Freq: Four times a day (QID) | ORAL | Status: AC | PRN

## 2014-12-20 MED ORDER — OXYCODONE HCL ER 10 MG PO T12A: 10.0000 mg | EXTENDED_RELEASE_TABLET | Freq: Two times a day (BID) | ORAL | Status: AC

## 2014-12-20 MED ORDER — OXYCODONE HCL 5 MG PO TABS: 5.0000 mg | ORAL_TABLET | ORAL | Status: AC | PRN

## 2014-12-20 NOTE — Progress Notes (Signed)
Physical Therapy Treatment Patient Details Name: Carrie House MRN: [id number] DOB: [birth year] Today's Date: 12/20/2014    History of Present Illness Pt admitted for L TKA with PMHx of polio, osteoporosis, DDD    PT Comments    Pt presents with improved ROM, strength, and ambulation endurance since last visit.Will continue to work toward increased gait distance. Pt educated for CPM and Heel roll use. In chair to see OT end of session with encouragement to return to CPM after OT.    Follow Up Recommendations  Home health PT;Supervision for mobility/OOB     Equipment Recommendations  None recommended by PT    Recommendations for Other Services OT consult     Precautions / Restrictions Precautions Precautions: Knee;Fall Restrictions Weight Bearing Restrictions: Yes LLE Weight Bearing: Weight bearing as tolerated    Mobility  Bed Mobility Overal bed mobility: Modified Independent Bed Mobility: Supine to Sit     Supine to sit: Modified independent (Device/Increase time);HOB elevated     General bed mobility comments: cues for sequence with increased time, use of rail  Transfers Overall transfer level: Needs assistance   Transfers: Sit to/from Stand Sit to Stand: Min assist         General transfer comment: Min assist from bed with cues for sequence and elevation. Initial rep required elevated bed and two trials to complete but second rep more successful with R hand supported on RW  Ambulation/Gait Ambulation/Gait assistance: Min guard Ambulation Distance (Feet): 40 Feet Assistive device: Rolling walker (2 wheeled) Gait Pattern/deviations: Step-to pattern;Decreased stride length;Wide base of support   Gait velocity interpretation: Below normal speed for age/gender General Gait Details: Cues for sequence and safety. Improved posture and head position during PM Tx.    Stairs Stairs: Yes Stairs assistance: Min guard Stair Management: No rails;Backwards;With  walker Number of Stairs: 1 General stair comments: Practiced one step to mimic getting into shower. Pt just needed min VC for sequencing and safety  Wheelchair Mobility    Modified Rankin (Stroke Patients Only)       Balance Overall balance assessment: Needs assistance   Sitting balance-Leahy Scale: Good       Standing balance-Leahy Scale: Poor Standing balance comment: use of RW                    Cognition Arousal/Alertness: Awake/alert Behavior During Therapy: WFL for tasks assessed/performed Overall Cognitive Status: Within Functional Limits for tasks assessed                      Exercises Total Joint Exercises Quad Sets: AROM;Left;10 reps;Supine Heel Slides: Left;10 reps;Supine;AROM Long Arc Quad: AROM;Left;10 reps Goniometric ROM: 4-45    General Comments        Pertinent Vitals/Pain Pain Assessment: 0-10 Pain Score: 7  Pain Descriptors / Indicators: Aching Pain Intervention(s): Repositioned;Ice applied;Monitored during session    Home Living                      Prior Function            PT Goals (current goals can now be found in the care plan section) Acute Rehab PT Goals Patient Stated Goal: be able to walk and return home PT Goal Formulation: With patient/family Time For Goal Achievement: 12/27/14 Potential to Achieve Goals: Good Progress towards PT goals: Progressing toward goals    Frequency  7X/week    PT Plan      Co-evaluation  End of Session Equipment Utilized During Treatment: Gait belt Activity Tolerance: Patient tolerated treatment well Patient left: in chair;with call bell/phone within reach;with family/visitor present     Time: [id number] PT Time Calculation (min) (ACUTE ONLY): 35 min  Charges:  $Gait Training: 8-22 mins $Therapeutic Exercise: 8-22 mins                    G CodesJimmy Picket:      Carrie House 12/20/2014, 2:27 PM Jimmy PicketJustin Hamna Asa, SPT 12/20/2014 2:27 PM

## 2014-12-20 NOTE — Evaluation (Signed)
Physical Therapy Evaluation Patient Details Name: Carrie CellarSusan Schmelzle MRN: [id number] DOB: 31-Dec-1945 Today's Date: 12/20/2014   History of Present Illness  Pt admitted for L TKA with PMHx of polio, osteoporosis, DDD  Clinical Impression  Pt very pleasant and willing to be up and moving for return home. Pt with history of Polio with decreased strength RLE making transfers more difficult but moving well with education and cues. Pt with decreased strength, function, gait and mobility who will benefit from acute therapy to maximize mobility, independence and function for return home. Spouse present throughout for education.     Follow Up Recommendations Home health PT;Supervision for mobility/OOB    Equipment Recommendations  None recommended by PT    Recommendations for Other Services OT consult     Precautions / Restrictions Precautions Precautions: Knee;Fall Restrictions LLE Weight Bearing: Weight bearing as tolerated      Mobility  Bed Mobility Overal bed mobility: Needs Assistance Bed Mobility: Supine to Sit     Supine to sit: Min guard;HOB elevated     General bed mobility comments: cues for sequence with increased time and use of belt to move LLE, use of rail   Transfers Overall transfer level: Needs assistance   Transfers: Sit to/from Stand Sit to Stand: Mod assist;Min guard         General transfer comment: mod assist from bed with cues for sequence and assist for anterior translation and elevation, [facility], minguard with cues for hand placement and controlled descent to sit  Ambulation/Gait Ambulation/Gait assistance: Min guard Ambulation Distance (Feet): 15 Feet Assistive device: Rolling walker (2 wheeled) Gait Pattern/deviations: Step-to pattern;Decreased stride length;Trunk flexed;Wide base of support   Gait velocity interpretation: Below normal speed for age/gender General Gait Details: cues for posture, position in RW, sequence, safety and looking  up. 15'x 2  Stairs            Wheelchair Mobility    Modified Rankin (Stroke Patients Only)       Balance                                             Pertinent Vitals/Pain      Home Living Family/patient expects to be discharged to:: Private residence Living Arrangements: Spouse/significant other Available Help at Discharge: Family;Available 24 hours/day Type of Home: House Home Access: Level entry     Home Layout: One level Home Equipment: Walker - 2 wheels;Bedside commode;Shower seat - built in      Prior Function Level of Independence: Independent               Hand Dominance   Dominant Hand: Left    Extremity/Trunk Assessment   Upper Extremity Assessment: Overall WFL for tasks assessed           Lower Extremity Assessment: LLE deficits/detail   LLE Deficits / Details: limited strength and ROm as expected post op  Cervical / Trunk Assessment: Kyphotic;Other exceptions  Communication   Communication: No difficulties  Cognition Arousal/Alertness: Awake/alert Behavior During Therapy: WFL for tasks assessed/performed Overall Cognitive Status: Within Functional Limits for tasks assessed                      General Comments      Exercises Total Joint Exercises Quad Sets: AROM;Left;10 reps;Supine Heel Slides: AAROM;Left;10 reps;Supine      Assessment/Plan  PT Assessment    PT Diagnosis Difficulty walking   PT Problem List    PT Treatment Interventions     PT Goals (Current goals can be found in the Care Plan section) Acute Rehab PT Goals Patient Stated Goal: be able to walk and return home PT Goal Formulation: With patient/family Time For Goal Achievement: 12/27/14 Potential to Achieve Goals: Good    Frequency     Barriers to discharge        Co-evaluation               End of Session Equipment Utilized During Treatment: Gait belt Activity Tolerance: Patient tolerated treatment  well Patient left: in chair;with call bell/phone within reach;with family/visitor present Nurse Communication: Mobility status;Precautions;Weight bearing status         Time: 0715-0803 PT Time Calculation (min) (ACUTE ONLY): 48 min   Charges:   PT Evaluation $Initial PT Evaluation Tier I: 1 Procedure PT Treatments $Therapeutic Activity: 8-22 mins   PT G CodesDelorse Lek 12/20/2014, 8:04 AM Delaney Meigs, PT [phone]

## 2014-12-20 NOTE — Progress Notes (Signed)
SPORTS MEDICINE AND JOINT REPLACEMENT  Georgena SpurlingStephen Lucey, MD   Altamese CabalMaurice Ayrianna Mcginniss, PA-C [address], [place], [place]  [address]                             [phone]   PROGRESS NOTE  Subjective:  negative for Chest Pain  negative for Shortness of Breath  negative for Nausea/Vomiting   negative for Calf Pain  negative for Bowel Movement   Tolerating Diet: yes         Patient reports pain as 5 on 0-10 scale.    Objective: Vital signs in last 24 hours:   Patient Vitals for the past 24 hrs:  BP Temp Temp src Pulse Resp SpO2  12/20/14 1001 (!) 106/58 mmHg - - - - -  12/20/14 0531 (!) 123/53 mmHg 98.8 F (37.1 C) Oral (!) 56 18 100 %  12/19/14 2300 (!) 112/47 mmHg - - 60 18 100 %  12/19/14 2131 - 98 F (36.7 C) Oral - - -  12/19/14 2029 (!) 116/45 mmHg - Oral 64 16 96 %  12/19/14 1806 (!) 129/55 mmHg 98.3 F (36.8 C) Oral 63 14 100 %  12/19/14 1747 - 97.7 F (36.5 C) - 75 10 99 %  12/19/14 1745 - - - 60 10 100 %  12/19/14 1730 - - - (!) 58 12 100 %  12/19/14 1715 - - - 64 19 100 %  12/19/14 1707 100/65 mmHg - - 60 15 100 %  12/19/14 1700 (!) 111/49 mmHg - - (!) 55 10 98 %  12/19/14 1637 (!) 121/50 mmHg - - (!) 54 10 97 %  12/19/14 1622 (!) 125/58 mmHg - - (!) 59 13 100 %  12/19/14 1607 (!) 121/55 mmHg - - (!) 52 15 100 %  12/19/14 1552 (!) 116/54 mmHg - - (!) 54 10 100 %  12/19/14 1537 (!) 121/54 mmHg - - 62 15 99 %  12/19/14 1522 (!) 117/53 mmHg - - (!) 58 14 100 %  12/19/14 1507 (!) 112/56 mmHg - - 60 11 95 %  12/19/14 1452 (!) 115/53 mmHg - - (!) 58 11 97 %  12/19/14 1437 (!) 107/47 mmHg - - 61 20 99 %  12/19/14 1430 - 97.4 F (36.3 C) - - - -  12/19/14 1423 (!) 109/48 mmHg - - (!) 59 10 100 %  12/19/14 1407 (!) 126/58 mmHg - - (!) 58 11 100 %  12/19/14 1400 - - - (!) 57 (!) 9 100 %  12/19/14 1352 (!) 121/48 mmHg - - (!) 55 11 99 %  12/19/14 1345 (!) 121/48 mmHg - - (!) 57 (!) 9 100 %  12/19/14 1330 (!) 142/96 mmHg - - 65 16 100 %  12/19/14 1315 133/69 mmHg  97.4 F (36.3 C) - (!) 58 13 100 %  12/19/14 1300 (!) 124/57 mmHg - - (!) 51 (!) 8 100 %  12/19/14 1245 (!) 122/53 mmHg - - (!) 58 12 100 %    @flow {1959:LAST@   Intake/Output from previous day:   11/21 0701 - 11/22 0700 In: 3320 [P.O.:680; I.V.:2540] Out: 400 [Urine:300]   Intake/Output this shift:   11/22 0701 - 11/22 1900 In: 240 [P.O.:240] Out: -    Intake/Output      11/21 0701 - 11/22 0700 11/22 0701 - 11/23 0700   P.O. 680 240   I.V. (mL/kg) 2540 (23.5)    IV Piggyback  100    Total Intake(mL/kg) 3320 (30.8) 240 (2.2)   Urine (mL/kg/hr) 300    Blood 100    Total Output 400     Net +2920 +240        Urine Occurrence 1 x 1 x   Emesis Occurrence 1 x       LABORATORY DATA:  Recent Labs  12/19/14 1840 12/20/14 0558  WBC 21.5* 16.1*  HGB 11.3* 10.3*  HCT 35.8* 31.9*  PLT 267 240    Recent Labs  12/19/14 1840 12/20/14 0558  NA  --  140  K  --  3.8  CL  --  105  CO2  --  28  BUN  --  9  CREATININE 0.82 0.90  GLUCOSE  --  122*  CALCIUM  --  8.2*   Lab Results  Component Value Date   INR 1.07 12/06/2014    Examination:  General appearance: alert, cooperative and no distress Extremities: Homans sign is negative, no sign of DVT  Wound Exam: clean, dry, intact   Drainage:  Scant/small amount Serosanguinous exudate  Motor Exam: Quadriceps and Hamstrings Intact  Sensory Exam: Deep Peroneal normal   Assessment:    1 Day Post-Op  Procedure(s) (LRB): LEFT TOTAL KNEE ARTHROPLASTY (Left)  ADDITIONAL DIAGNOSIS:  Active Problems:   S/P total knee arthroplasty  Acute Blood Loss Anemia   Plan: Physical Therapy as ordered Weight Bearing as Tolerated (WBAT)  DVT Prophylaxis:  Lovenox  DISCHARGE PLAN: Home  DISCHARGE NEEDS: HHPT, CPM, Walker and 3-in-1 comode seat         Britt Petroni 12/20/2014, 11:18 AM

## 2014-12-20 NOTE — Discharge Instructions (Signed)

## 2014-12-20 NOTE — Care Management Note (Addendum)
Case Management Note  Patient Details  Name: Carrie House MRN: [id number] Date of Birth: 04-08-1945  Subjective/Objective:  69 yr old female s/p Left total knee arthroplasty.                  Action/Plan: Case manager spoke with patient and her husband at the bedside , concerning home health and DME needs. Choice was offered. Patient was preoperatively setup with Therapy Direct through [facility] in [place]. Case manager called to confirm, faxed demographics, H&P, and [facility] @ [phone]. Patient has Rolling walker and 3in1, shower bench and CPM at home.. Family will assist at discharge.  Expected Discharge Date:   12/21/14               Expected Discharge Plan:   Home with Home Health  In-House Referral:  NA  Discharge planning Services  CM Consult  Post Acute Care Choice:  Home Health Choice offered to:  Patient  DME Arranged:  N/A DME Agency:  NA  HH Arranged:  PT HH Agency:  [facility] (therapy direct through [facility])  Status of Service:  In process, will continue to follow  Medicare Important Message Given:    Date Medicare IM Given:    Medicare IM give by:    Date Additional Medicare IM Given:    Additional Medicare Important Message give by:     If discussed at Long Length of Stay Meetings, dates discussed:    Additional Comments:  Carrie House, Carrie Naomi, RN 12/20/2014, 11:27 AM

## 2014-12-20 NOTE — Progress Notes (Signed)
Pt requiring min to min guard assist for ADL and mobility. All education completed.  No further OT needs.  Pt will have assist of her husband upon discharge. No further OT needs.   12/20/14 1500  OT Visit Information  Last OT Received On 12/20/14  Assistance Needed +1  History of Present Illness Pt admitted for L TKA with PMHx of polio, osteoporosis, DDD  Precautions  Precautions Knee;Fall  Restrictions  Weight Bearing Restrictions Yes  LLE Weight Bearing WBAT  Home Living  Family/patient expects to be discharged to: Private residence  Living Arrangements Spouse/significant other  Available Help at Discharge Family;Available 24 hours/day  Type of Home House  Home Access Level entry  Home Layout One level  Bathroom Shower/Tub Walk-in IT trainershower  Bathroom Toilet Standard  Home Equipment Walker - 2 wheels;BSC;Shower seat  Prior Function  Level of Independence Independent  Communication  Communication No difficulties  Pain Assessment  Pain Assessment Faces  Faces Pain Scale 2  Pain Location L knee  Pain Descriptors / Indicators Operative site guarding;Grimacing;Sore  Pain Intervention(s) Limited activity within patient's tolerance;Monitored during session;Premedicated before session;Repositioned  Cognition  Arousal/Alertness Awake/alert  Behavior During Therapy WFL for tasks assessed/performed  Overall Cognitive Status Within Functional Limits for tasks assessed  Upper Extremity Assessment  Upper Extremity Assessment Overall WFL for tasks assessed  Lower Extremity Assessment  Lower Extremity Assessment Defer to PT evaluation  ADL  Overall ADL's  Needs assistance/impaired  Eating/Feeding Independent;Sitting  Grooming Wash/dry hands;Standing;Supervision/safety  Upper Body Bathing Set up;Sitting  Lower Body Bathing Sit to/from stand;With adaptive equipment;Min guard  Upper Body Dressing  Set up;Sitting  Lower Body Dressing Min guard;With adaptive equipment;Sit to/from Advertising account executivestand   Toilet Transfer Min guard;Ambulation;BSC;RW  Toileting- DealerClothing Manipulation and Hygiene Min guard;Sit to/from stand  General ADL Comments Educated pt and husband in use of AE for LB ADL, practiced toileting and stood at sink for grooming.  Vision- History  Patient Visual Report No change from baseline  Bed Mobility  Bed Mobility Sit to Supine  Sit to supine Min assist  General bed mobility comments assisted with L LE  Transfers  Overall transfer level Needs assistance  Equipment used Rolling walker (2 wheeled)  Transfers Sit to/from Stand  Sit to Stand Min assist;Min guard  General transfer comment min from recliner, min guard from 3 in 1  Balance  Overall balance assessment Needs assistance  Sitting balance-Leahy Scale Good  Standing balance-Leahy Scale Poor  OT - End of Session  Equipment Utilized During Treatment Gait belt;Rolling walker  Activity Tolerance Patient tolerated treatment well  Patient left in bed;with call bell/phone within reach  OT Assessment  OT Therapy Diagnosis  Generalized weakness;Acute pain  OT Recommendation/Assessment Patient does not need any further OT services  OT Recommendation  Follow Up Recommendations No OT follow up;Supervision/Assistance - 24 hour  Acute Rehab OT Goals  Patient Stated Goal be able to walk and return home  OT Time Calculation  OT Start Time (ACUTE ONLY) 1410  OT Stop Time (ACUTE ONLY) 1444  OT Time Calculation (min) 34 min  OT General Charges  $OT Visit 1 Procedure  OT Evaluation  $Initial OT Evaluation Tier I 1 Procedure  OT Treatments  $Self Care/Home Management  8-22 mins  Written Expression  Dominant Hand Left  12/20/2014 Martie RoundJulie Koury Roddy, OTR/L Pager: [phone]

## 2014-12-20 NOTE — Anesthesia Postprocedure Evaluation (Signed)
Anesthesia Post Note  Patient: Carrie House  Procedure(s) Performed: Procedure(s) (LRB): LEFT TOTAL KNEE ARTHROPLASTY (Left)  Patient location during evaluation: PACU Anesthesia Type: Spinal Level of consciousness: awake Pain management: pain level controlled Vital Signs Assessment: post-procedure vital signs reviewed and stable Respiratory status: spontaneous breathing Cardiovascular status: stable Postop Assessment: Spinal receding and No signs of nausea or vomiting Anesthetic complications: no    Last Vitals:  Filed Vitals:   12/19/14 2300 12/20/14 0531  BP: 112/47 123/53  Pulse: 60 56  Temp:  37.1 C  Resp: 18 18    Last Pain:  Filed Vitals:   12/20/14 0726  PainSc: Asleep    LLE Motor Response: Purposeful movement, Responds to commands LLE Sensation: Full sensation, Pain, No numbness, No tingling RLE Motor Response: Purposeful movement, Responds to commands RLE Sensation: Full sensation L Sensory Level: S1-Sole of foot, small toes R Sensory Level: S1-Sole of foot, small toes  Clayborne Divis

## 2014-12-21 NOTE — Op Note (Signed)
TOTAL KNEE REPLACEMENT OPERATIVE NOTE:  12/19/2014  6:40 AM  PATIENT:  Carrie House  69 y.o. female  PRE-OPERATIVE DIAGNOSIS:  primary osteoarthritis left knee  POST-OPERATIVE DIAGNOSIS:  primary osteoarthritis left knee  PROCEDURE:  Procedure(s): LEFT TOTAL KNEE ARTHROPLASTY  SURGEON:  Surgeon(s): Dannielle Huh, MD  PHYSICIAN ASSISTANT: Altamese Cabal, [facility]  ANESTHESIA:   spinal  DRAINS: Hemovac  SPECIMEN: None  COUNTS:  Correct  TOURNIQUET:   Total Tourniquet Time Documented: Thigh (Left) - 53 minutes Total: Thigh (Left) - 53 minutes   DICTATION:  Indication for procedure:    The patient is a 69 y.o. female who has failed conservative treatment for primary osteoarthritis left knee.  Informed consent was obtained prior to anesthesia. The risks versus benefits of the operation were explain and in a way the patient can, and did, understand.   On the implant demand matching protocol, this patient scored 10.  Therefore, this patient did" "did not receive a polyethylene insert with vitamin E which is a high demand implant.  Description of procedure:     The patient was taken to the operating room and placed under anesthesia.  The patient was positioned in the usual fashion taking care that all body parts were adequately padded and/or protected.  I foley catheter was not placed.  A tourniquet was applied and the leg prepped and draped in the usual sterile fashion.  The extremity was exsanguinated with the esmarch and tourniquet inflated to 350 mmHg.  Pre-operative range of motion was normal.  The knee was in 5 degree of mild varus.  A midline incision approximately 6-7 inches long was made with a #10 blade.  A new blade was used to make a parapatellar arthrotomy going 2-3 cm into the quadriceps tendon, over the patella, and alongside the medial aspect of the patellar tendon.  A synovectomy was then performed with the #10 blade and forceps. I then elevated the deep MCL off the  medial tibial metaphysis subperiosteally around to the semimembranosus attachment.    I everted the patella and used calipers to measure patellar thickness.  I used the reamer to ream down to appropriate thickness to recreate the native thickness.  I then removed excess bone with the rongeur and sagittal saw.  I used the appropriately sized template and drilled the three lug holes.  I then put the trial in place and measured the thickness with the calipers to ensure recreation of the native thickness.  The trial was then removed and the patella subluxed and the knee brought into flexion.  A homan retractor was place to retract and protect the patella and lateral structures.  A Z-retractor was place medially to protect the medial structures.  The extra-medullary alignment system was used to make cut the tibial articular surface perpendicular to the anamotic axis of the tibia and in 3 degrees of posterior slope.  The cut surface and alignment jig was removed.  I then used the intramedullary alignment guide to make a 6 valgus cut on the distal femur.  I then marked out the epicondylar axis on the distal femur.  The posterior condylar axis measured 3 degrees.  I then used the anterior referencing sizer and measured the femur to be a size 7.  The 4-In-1 cutting block was screwed into place in external rotation matching the posterior condylar angle, making our cuts perpendicular to the epicondylar axis.  Anterior, posterior and chamfer cuts were made with the sagittal saw.  The cutting block and cut pieces  were removed.  A lamina spreader was placed in 90 degrees of flexion.  The ACL, PCL, menisci, and posterior condylar osteophytes were removed.  A 13 mm spacer blocked was found to offer good flexion and extension gap balance after minimal in degree releasing.   The scoop retractor was then placed and the femoral finishing block was pinned in place.  The small sagittal saw was used as well as the lug drill to  finish the femur.  The block and cut surfaces were removed and the medullary canal hole filled with autograft bone from the cut pieces.  The tibia was delivered forward in deep flexion and external rotation.  A size D tray was selected and pinned into place centered on the medial 1/3 of the tibial tubercle.  The reamer and keel was used to prepare the tibia through the tray.    I then trialed with the size 7 femur, size D tibia, a 13 mm insert and the 32 patella.  I had excellent flexion/extension gap balance, excellent patella tracking.  Flexion was full and beyond 120 degrees; extension was zero.  These components were chosen and the staff opened them to me on the back table while the knee was lavaged copiously and the cement mixed.  The soft tissue was infiltrated with 60cc of exparel 1.3% through a 21 gauge needle.  I cemented in the components and removed all excess cement.  The polyethylene tibial component was snapped into place and the knee placed in extension while cement was hardening.  The capsule was infilltrated with 30cc of .25% Marcaine with epinephrine.  A hemovac was place in the joint exiting superolaterally.  A pain pump was place superomedially superficial to the arthrotomy.  Once the cement was hard, the tourniquet was let down.  Hemostasis was obtained.  The arthrotomy was closed with figure-8 #1 vicryl sutures.  The deep soft tissues were closed with #0 vicryls and the subcuticular layer closed with a running #2-0 vicryl.  The skin was reapproximated and closed with skin staples.  The wound was dressed with xeroform, 4 x4's, 2 ABD sponges, a single layer of webril and a TED stocking.   The patient was then awakened, extubated, and taken to the recovery room in stable condition.  BLOOD LOSS:  300cc DRAINS: 1 hemovac, 1 pain catheter COMPLICATIONS:  None.  PLAN OF CARE: Admit to inpatient   PATIENT DISPOSITION:  PACU - hemodynamically stable.   Delay start of Pharmacological  VTE agent (>24hrs) due to surgical blood loss or risk of bleeding:  not applicable  Please fax a copy of this op note to my office at [phone] (please only include page 1 and 2 of the Case Information op note)

## 2014-12-27 NOTE — Discharge Summary (Signed)
SPORTS MEDICINE & JOINT REPLACEMENT   Georgena Spurling, MD   Altamese Cabal, PA-C [address] [place], [place], [place]  [address]                             [phone]  PATIENT ID: Carrie House        MRN:  [id number]          DOB/AGE: [birth year] / 69 y.o.    DISCHARGE SUMMARY  ADMISSION DATE:    12/19/2014 DISCHARGE DATE:  12/20/2014  ADMISSION DIAGNOSIS: primary osteoarthritis left knee    DISCHARGE DIAGNOSIS:  primary osteoarthritis left knee    ADDITIONAL DIAGNOSIS: Active Problems:   S/P total knee arthroplasty  Past Medical History  Diagnosis Date  . Hypertension     takes Amlodipine and Losartan daily  . Hypothyroidism     takes Synthroid daily  . Polio     at age 11  . Diverticulosis   . Hyperlipidemia     has never needed to be on meds per pt  . Peripheral edema     takes Lasix daily  . History of bronchitis several yrs ago  . Seasonal allergies     uses Flonase as needed  . Headache     occasionally  . Arthritis   . Joint pain   . Joint swelling   . Back pain     DDD and scoliosis  . Osteoporosis   . Urinary frequency   . Urinary urgency   . History of blood transfusion     no abnormal reaction noted  . Cataracts, bilateral     immature  . History of shingles     PROCEDURE: Procedure(s): LEFT TOTAL KNEE ARTHROPLASTY on 12/19/2014  CONSULTS:     HISTORY:  See H&P in chart  HOSPITAL COURSE:  Uriyah Massimo is a 69 y.o. admitted on 12/19/2014 and found to have a diagnosis of primary osteoarthritis left knee.  After appropriate laboratory studies were obtained  they were taken to the operating room on 12/19/2014 and underwent Procedure(s): LEFT TOTAL KNEE ARTHROPLASTY.   They were given perioperative antibiotics:  Anti-infectives    Start     Dose/Rate Route Frequency Ordered Stop   12/19/14 2200  vancomycin (VANCOCIN) IVPB 1000 mg/200 mL premix     1,000 mg 200 mL/hr over 60 Minutes Intravenous Every 12 hours 12/19/14 1806 12/19/14 2228     .  Tolerated the procedure well.  Placed with a foley intraoperatively.  Given Ofirmev at induction and for 48 hours.    POD# 1: Vital signs were stable.  Patient denied Chest pain, shortness of breath, or calf pain.  Patient was started on Lovenox 30 mg subcutaneously twice daily at 8am.  Consults to PT, OT, and care management were made.  The patient was weight bearing as tolerated.  CPM was placed on the operative leg 0-90 degrees for 6-8 hours a day.  Incentive spirometry was taught.  Dressing was changed.        Continued  PT for ambulation and exercise program.  IV saline locked.  O2 discontinued.    The remainder of the hospital course was dedicated to ambulation and strengthening.   The patient was discharged on 1 day post op in  Good condition.  Blood products given:none  DIAGNOSTIC STUDIES: Recent vital signs: No data found.      Recent laboratory studies: No results for input(s): WBC, HGB, HCT, PLT in  the last 168 hours. No results for input(s): NA, K, CL, CO2, BUN, CREATININE, GLUCOSE, CALCIUM in the last 168 hours. Lab Results  Component Value Date   INR 1.07 12/06/2014     Recent Radiographic Studies :  Dg Chest 2 View  12/06/2014  CLINICAL DATA:  Preop left knee surgery.  History of hypertension. EXAM: CHEST  2 VIEW COMPARISON:  None. FINDINGS: The heart size and mediastinal contours are within normal limits. Both lungs are clear. The visualized skeletal structures are unremarkable. IMPRESSION: No active cardiopulmonary disease. Electronically Signed   By: Charlett NoseKevin  Dover M.D.   On: 12/06/2014 12:35    DISCHARGE INSTRUCTIONS: Discharge Instructions    CPM    Complete by:  As directed   Continuous passive motion machine (CPM):      Use the CPM from 0 to 90 for 6-8 hours per day.      You may increase by 10 per day.  You may break it up into 2 or 3 sessions per day.      Use CPM for 2 weeks or until you are told to stop.     Call MD / Call 911    Complete by:  As  directed   If you experience chest pain or shortness of breath, CALL 911 and be transported to the hospital emergency room.  If you develope a fever above 101 F, pus (white drainage) or increased drainage or redness at the wound, or calf pain, call your surgeon's office.     Change dressing    Complete by:  As directed   Change dressing on Wednesday, then change the dressing daily with sterile 4 x 4 inch gauze dressing and apply TED hose.     Constipation Prevention    Complete by:  As directed   Drink plenty of fluids.  Prune juice may be helpful.  You may use a stool softener, such as Colace (over the counter) 100 mg twice a day.  Use MiraLax (over the counter) for constipation as needed.     Diet - low sodium heart healthy    Complete by:  As directed      Do not put a pillow under the knee. Place it under the heel.    Complete by:  As directed      Driving restrictions    Complete by:  As directed   No driving for 6 weeks     Increase activity slowly as tolerated    Complete by:  As directed      Lifting restrictions    Complete by:  As directed   No lifting for 6 weeks     TED hose    Complete by:  As directed   Use stockings (TED hose) for 2 weeks on both leg(s).  You may remove them at night for sleeping.           DISCHARGE MEDICATIONS:     Medication List    TAKE these medications        acetaminophen 500 MG tablet  Commonly known as:  TYLENOL  Take 500-1,000 mg by mouth every 8 (eight) hours as needed for mild pain or moderate pain.     amLODipine 5 MG tablet  Commonly known as:  NORVASC  Take 5 mg by mouth at bedtime.     CALCIUM 600 + D PO  Take 1 tablet by mouth daily.     clobetasol cream 0.05 %  Commonly known as:  TEMOVATE  Apply 1 application topically daily.     enoxaparin 40 MG/0.4ML injection  Commonly known as:  LOVENOX  Inject 0.4 mLs (40 mg total) into the skin daily.     fluticasone 50 MCG/ACT nasal spray  Commonly known as:  FLONASE   Place 2 sprays into both nostrils daily as needed for allergies or rhinitis.     furosemide 20 MG tablet  Commonly known as:  LASIX  Take 20 mg by mouth daily.     levothyroxine 100 MCG tablet  Commonly known as:  SYNTHROID, LEVOTHROID  Take 100 mcg by mouth daily before breakfast.     losartan 100 MG tablet  Commonly known as:  COZAAR  Take 100 mg by mouth daily.     oxyCODONE 5 MG immediate release tablet  Commonly known as:  Oxy IR/ROXICODONE  Take 1-2 tablets (5-10 mg total) by mouth every 3 (three) hours as needed for breakthrough pain.     oxyCODONE 10 mg 12 hr tablet  Commonly known as:  oxyCODONE  Take 1 tablet (10 mg total) by mouth every 12 (twelve) hours.     tiZANidine 2 MG tablet  Commonly known as:  ZANAFLEX  Take 1 tablet (2 mg total) by mouth every 6 (six) hours as needed for muscle spasms.     Vitamin D 2000 UNITS tablet  Take 2,000 Units by mouth daily.        FOLLOW UP VISIT:       Follow-up Information    Follow up with [facility].   Specialty:  Home Health Services   Contact information:   [address] [place] [address] [phone]       Follow up with Raymon Mutton, MD On 01/03/2015.   Specialty:  Orthopedic Surgery   Contact information:   [address] [place] [address] [phone]       DISPOSITION: HOME   CONDITION:  Good   Andria Head 12/27/2014, 7:56 AM

## 2016-12-06 IMAGING — CR DG CHEST 2V
2 series · 2 of 2 positions shown · non-contrast
Comparison: None.

CLINICAL DATA: Preop left knee surgery.  History of hypertension.

EXAM:
CHEST  2 VIEW

[w chest pa]
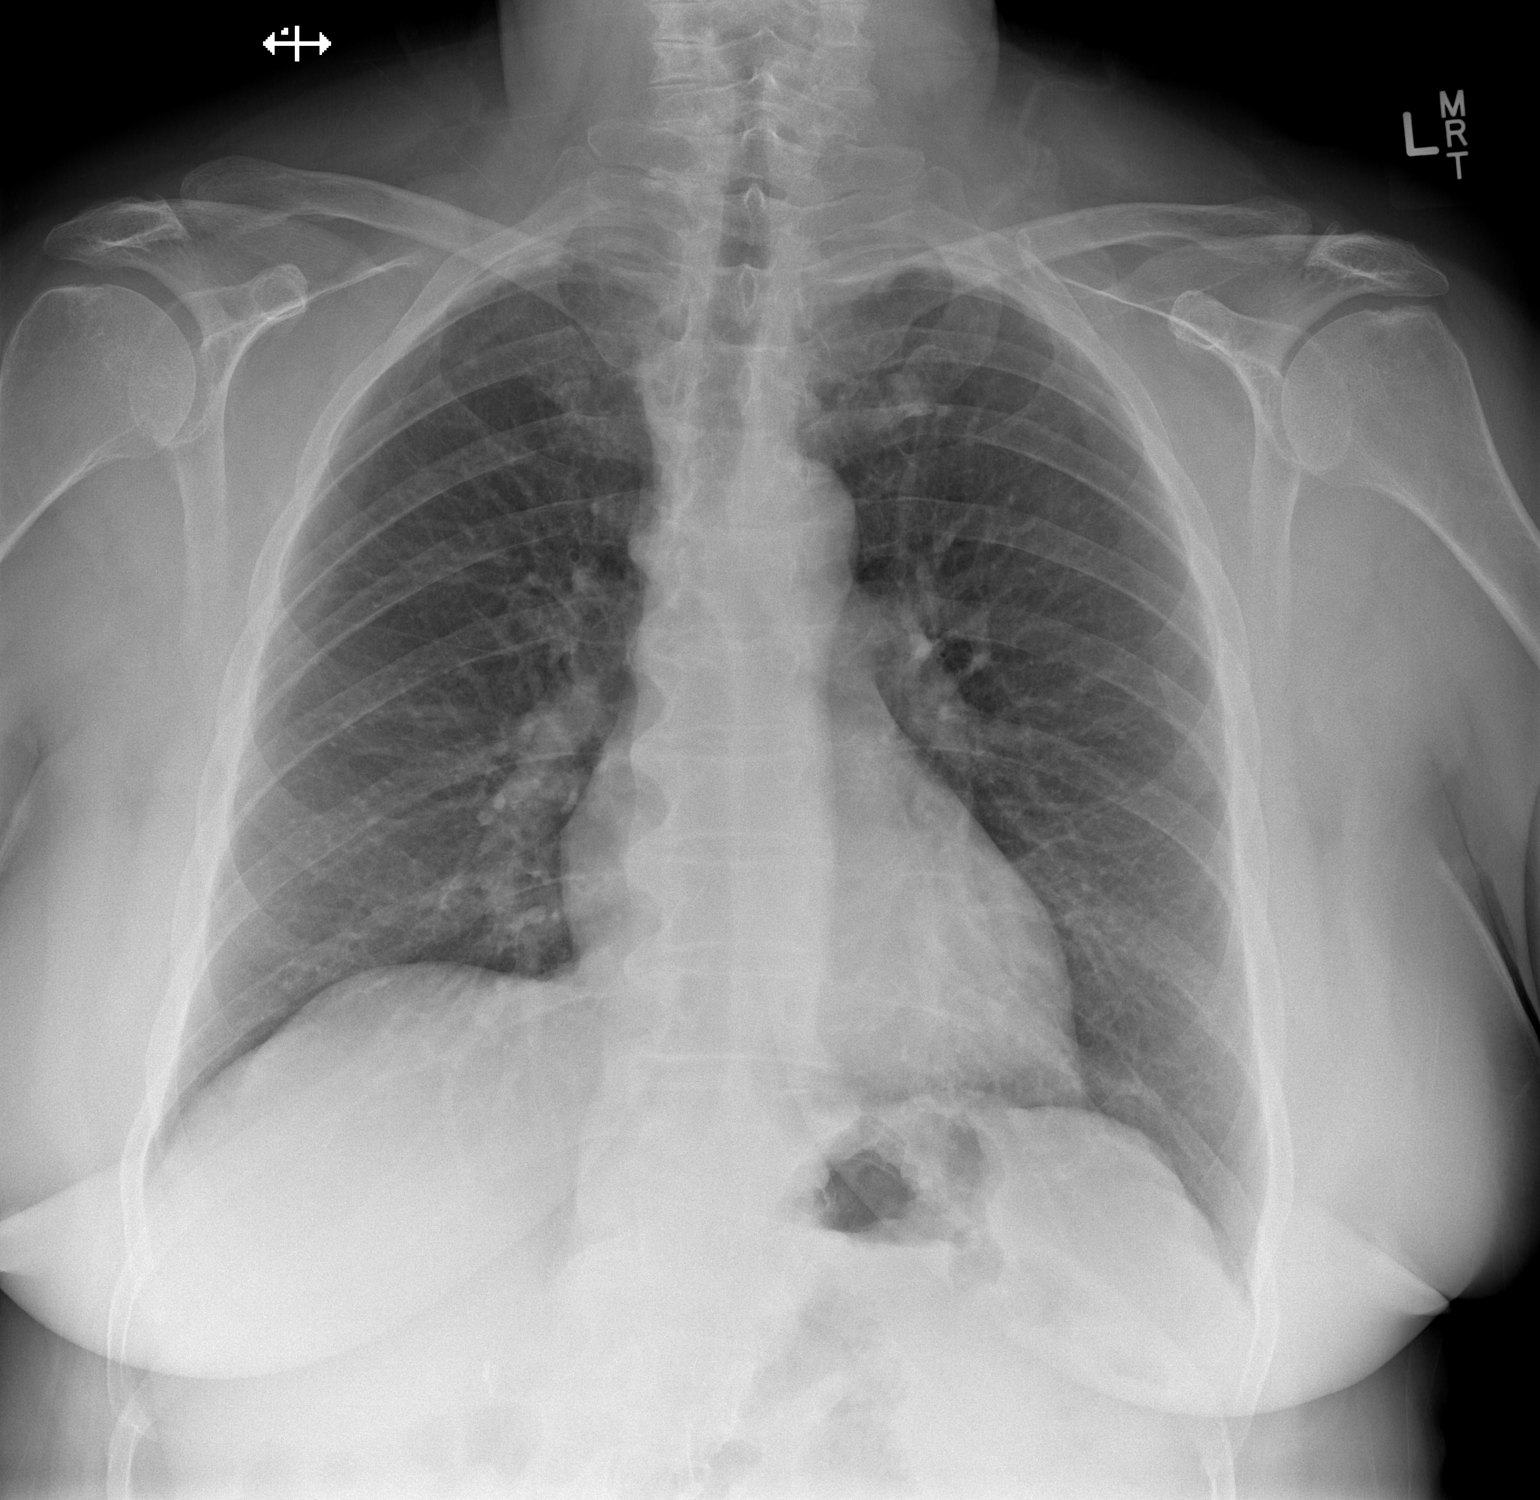

[w chest lat]
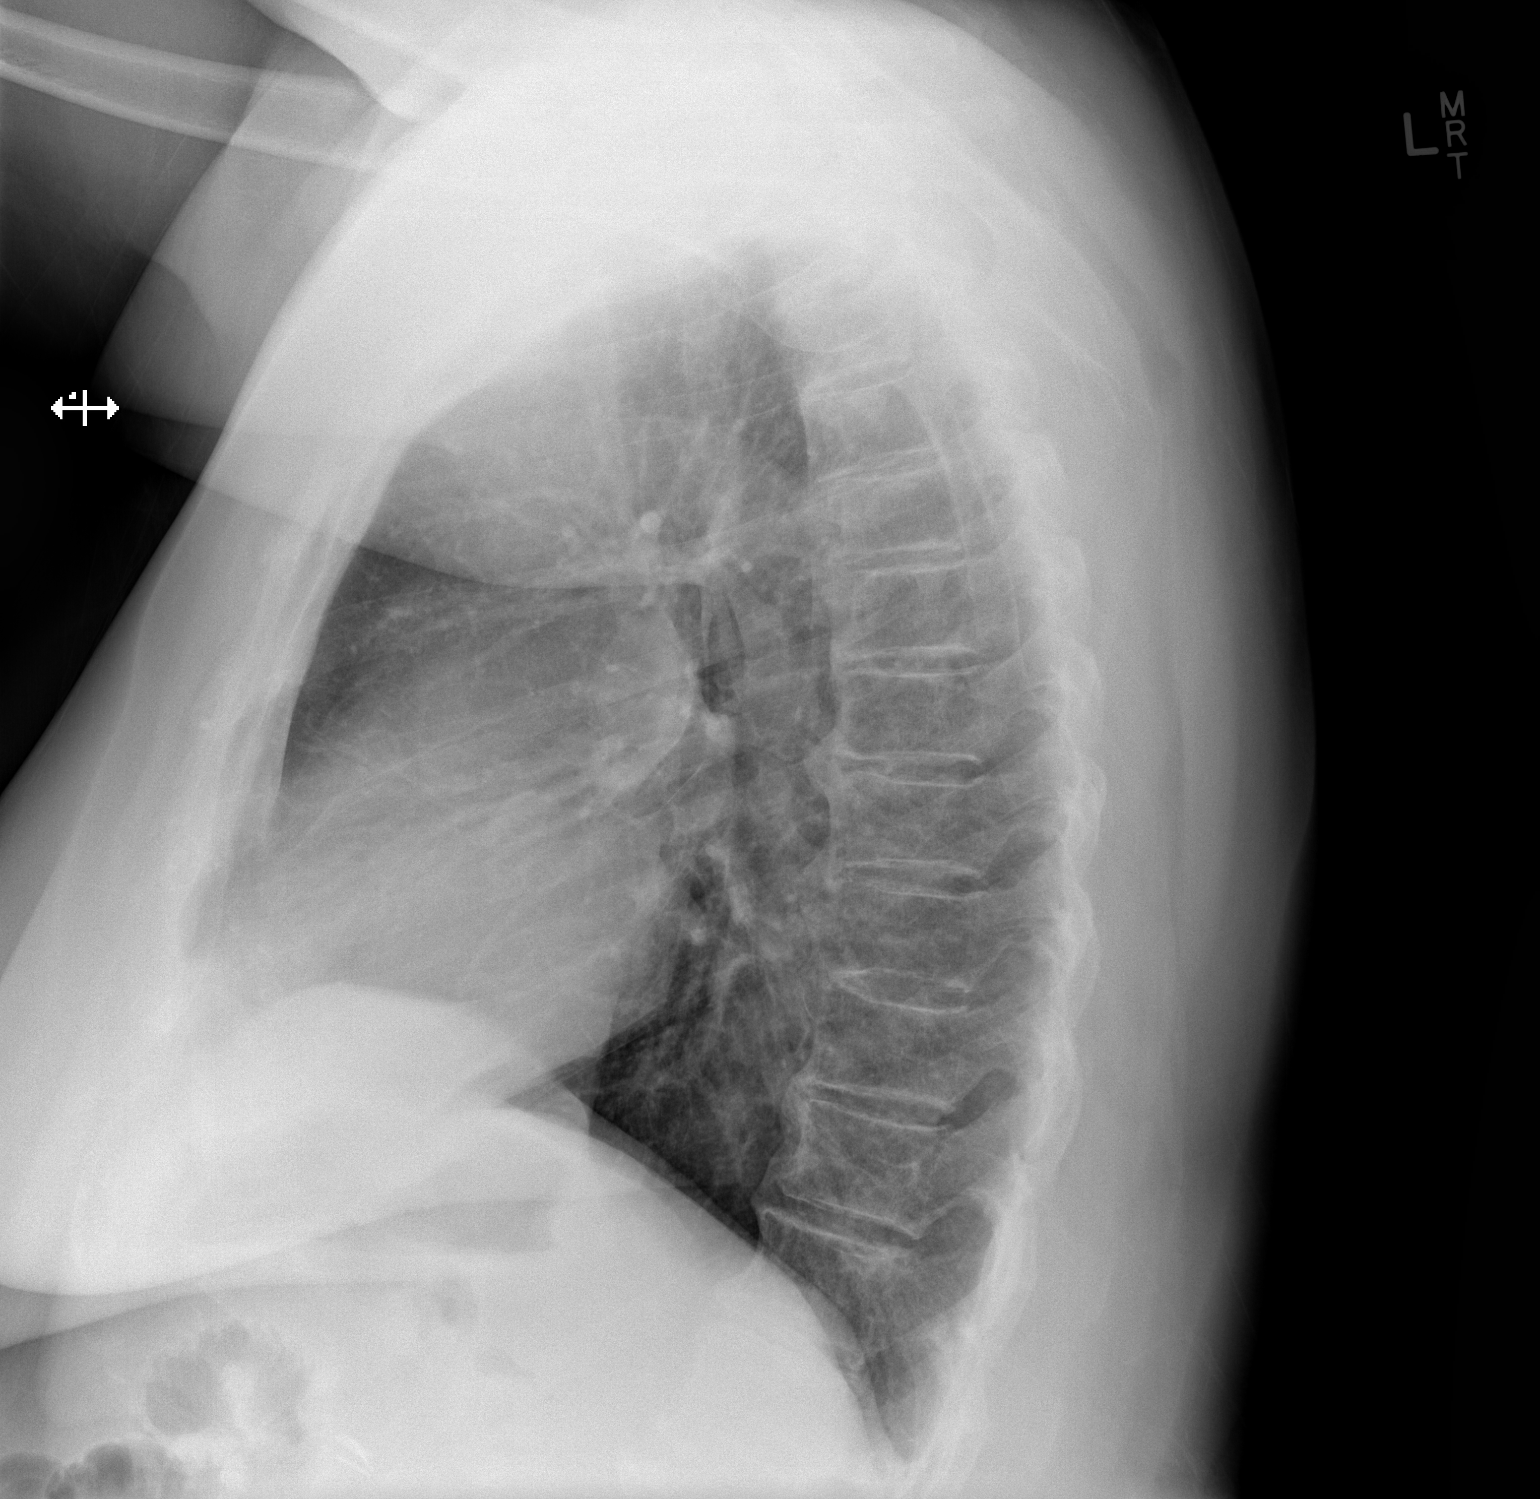

[2 of 2 positions shown; findings below may reference images not displayed]

FINDINGS: The heart size and mediastinal contours are within normal limits.
Both lungs are clear. The visualized skeletal structures are
unremarkable.
IMPRESSION: No active cardiopulmonary disease.
